# Patient Record
Sex: Male | Born: 1995 | Hispanic: Yes | Marital: Single
Health system: Southern US, Community
[De-identification: ages and names within clinical notes are randomized; demographics above are authoritative.]

---

## 2018-07-18 ENCOUNTER — Other Ambulatory Visit: Payer: Self-pay

## 2018-07-18 ENCOUNTER — Emergency Department (HOSPITAL_COMMUNITY)
Admission: EM | Admit: 2018-07-18 | Discharge: 2018-07-18 | Disposition: A | Discharge status: Home / Self Care | Payer: Self-pay | Attending: Emergency Medicine | Admitting: Emergency Medicine

## 2018-07-18 ENCOUNTER — Emergency Department (HOSPITAL_COMMUNITY): Payer: Self-pay

## 2018-07-18 DIAGNOSIS — R0602 Shortness of breath: Secondary | ICD-10-CM | POA: Insufficient documentation

## 2018-07-18 DIAGNOSIS — J40 Bronchitis, not specified as acute or chronic: Secondary | ICD-10-CM | POA: Insufficient documentation

## 2018-07-18 MED ORDER — IPRATROPIUM-ALBUTEROL 0.5-2.5 (3) MG/3ML IN SOLN
3.0000 mL | Freq: Once | RESPIRATORY_TRACT | Status: AC
  Administered 2018-07-18: 3 mL via RESPIRATORY_TRACT

## 2018-07-18 MED ORDER — ALBUTEROL SULFATE HFA 108 (90 BASE) MCG/ACT IN AERS
2.0000 | INHALATION_SPRAY | RESPIRATORY_TRACT | Status: DC | PRN
  Administered 2018-07-18: 2 via RESPIRATORY_TRACT
  Filled 2018-07-18: qty 6.7

## 2018-07-18 MED ORDER — PREDNISONE 10 MG PO TABS: 40.0000 mg | ORAL_TABLET | Freq: Every day | ORAL | 0 refills | Status: AC

## 2018-07-18 MED ORDER — BENZONATATE 100 MG PO CAPS: 100.0000 mg | ORAL_CAPSULE | Freq: Three times a day (TID) | ORAL | 0 refills | Status: AC

## 2018-07-18 NOTE — ED Triage Notes (Signed)
Pt here for increased shortness of breath and work of breathing over the last week.  Pt reports worse at night.  Denies any night sweats or neck soreness.  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  Lungs diminished in bases, shortness of breath worse when lying flat.

## 2018-07-18 NOTE — ED Provider Notes (Signed)
[facility] EMERGENCY DEPARTMENT Provider Note   CSN: [id number] Arrival date & time: 07/18/18  2035    History   Chief Complaint Chief Complaint  Patient presents with   Shortness of Breath    HPI Frederick Ford is a 23 y.o. male who presents to the ED with c/o cough and feeling short of breath x 1 week. Patient reports he has been home and not around anyone else. He states he has not traveled. He reports being out of work so has not been around anyone sick. Patient reports feeling like he is wheezing.     HPI  No past medical history on file.  There are no active problems to display for this patient.    Home Medications    Prior to Admission medications   Medication Sig Start Date End Date Taking? Authorizing Provider  benzonatate (TESSALON) 100 MG capsule Take 1 capsule (100 mg total) by mouth every 8 (eight) hours. 07/18/18   Janne Napoleon, NP  predniSONE (DELTASONE) 10 MG tablet Take 4 tablets (40 mg total) by mouth daily with breakfast. 07/18/18   Janne Napoleon, NP    Family History No family history on file.  Social History Social History   Tobacco Use   Smoking status: Not on file  Substance Use Topics   Alcohol use: Not on file   Drug use: Not on file     Allergies   Patient has no known allergies.   Review of Systems Review of Systems  Constitutional: Negative for chills and fever.  HENT: Positive for congestion and sneezing. Negative for ear pain, sinus pressure, sinus pain and sore throat.   Eyes: Negative for discharge, redness and itching.  Respiratory: Positive for cough, shortness of breath and wheezing.   Cardiovascular: Negative for chest pain.  Gastrointestinal: Negative for abdominal pain, nausea and vomiting.  Genitourinary: Negative for dysuria, frequency and urgency.  Musculoskeletal: Negative for myalgias.  Skin: Negative for rash.  Neurological: Negative for syncope and headaches.  Psychiatric/Behavioral:  Negative for confusion.     Physical Exam Updated Vital Signs BP 123/64 (BP Location: Right Arm)    Pulse 80    Temp 97.8 F (36.6 C) (Oral)    Resp 19    SpO2 100%   Physical Exam Vitals signs and nursing note reviewed.  Constitutional:      General: He is not in acute distress.    Appearance: He is well-developed.  HENT:     Head: Normocephalic.     Right Ear: Tympanic membrane normal.     Left Ear: Tympanic membrane normal.     Nose: Congestion present.     Mouth/Throat:     Mouth: Mucous membranes are moist.     Pharynx: Oropharynx is clear.  Eyes:     Extraocular Movements: Extraocular movements intact.     Conjunctiva/sclera: Conjunctivae normal.  Neck:     Musculoskeletal: Normal range of motion and neck supple. No neck rigidity.  Cardiovascular:     Rate and Rhythm: Normal rate and regular rhythm.  Pulmonary:     Effort: Pulmonary effort is normal. No respiratory distress.     Breath sounds: No wheezing or rales.     Comments: Examined after neb treatment.  Chest:     Chest wall: No tenderness.  Musculoskeletal: Normal range of motion.  Lymphadenopathy:     Cervical: No cervical adenopathy.  Skin:    General: Skin is warm and dry.  Neurological:  Mental Status: He is alert and oriented to person, place, and time.      ED Treatments / Results  Labs (all labs ordered are listed, but only abnormal results are displayed) Labs Reviewed - No data to display  Radiology Dg Chest 2 View  Result Date: 07/18/2018 CLINICAL DATA:  Shortness of breath. EXAM: CHEST - 2 VIEW COMPARISON:  None. FINDINGS: The lungs are clear without focal pneumonia, edema, pneumothorax or pleural effusion. Tiny nodule right mid lung likely a granuloma given patient age and the density of this nodule relative to its tiny size. The cardiopericardial silhouette is within normal limits for size. The visualized bony structures of the thorax are intact. IMPRESSION: No active cardiopulmonary  disease. Electronically Signed   By: Kennith Center M.D.   On: 07/18/2018 21:10    Procedures Procedures (including critical care time)  Medications Ordered in ED Medications  albuterol (PROVENTIL HFA;VENTOLIN HFA) 108 (90 Base) MCG/ACT inhaler 2 puff (2 puffs Inhalation Given 07/18/18 2226)  ipratropium-albuterol (DUONEB) 0.5-2.5 (3) MG/3ML nebulizer solution 3 mL (3 mLs Nebulization Given 07/18/18 2112)   Patient reports feeling more open after neb treatment.   Initial Impression / Assessment and Plan / ED Course  I have reviewed the triage vital signs and the nursing notes. Pt CXR negative for acute infiltrate. Patients symptoms are consistent with URI, likely viral etiology. Discussed that antibiotics are not indicated for viral infections. Pt will be discharged with symptomatic treatment.  Verbalizes understanding and is agreeable with plan. Pt is hemodynamically stable & in NAD prior to dc. Patient ask the question about screening for COVID 19 because his friend wanted to be sure patient did not have the virus. Discussed with the patient that he has no fever, has not traveled and reports he has not been around anyone that is sick and has been at home so he does not meet criteria for screening.   Final Clinical Impressions(s) / ED Diagnoses   Final diagnoses:  Bronchitis    ED Discharge Orders         Ordered    predniSONE (DELTASONE) 10 MG tablet  Daily with breakfast     07/18/18 2214    benzonatate (TESSALON) 100 MG capsule  Every 8 hours     07/18/18 2214           Kerrie [place], NP 07/18/18 2322    Terrilee Files, MD 07/19/18 1055

## 2018-07-18 NOTE — ED Notes (Signed)
Patient Alert and oriented to baseline. Stable and ambulatory to baseline. Patient verbalized understanding of the discharge instructions.  Patient belongings were taken by the patient.   

## 2018-07-18 NOTE — Discharge Instructions (Addendum)
Take tylenol and ibuprofen as needed for body aches. Your chest x-ray is negative. Since you have not been exposed to anyone with known COVID 19, you do not have a fever and have not traveled you do not meet the criteria for testing for the virus. Stay home, drink plenty of fluids and follow up with [facility] and wellness. Return here as needed.

## 2018-07-18 NOTE — ED Notes (Signed)
Pt back from X-ray.  

## 2018-07-18 NOTE — ED Notes (Signed)
Patient transported to X-ray 

## 2020-03-29 IMAGING — CR CHEST - 2 VIEW
2 series · 2 of 2 positions shown · non-contrast
Comparison: None.

CLINICAL DATA: Shortness of breath.

EXAM:
CHEST - 2 VIEW

[chest pa]
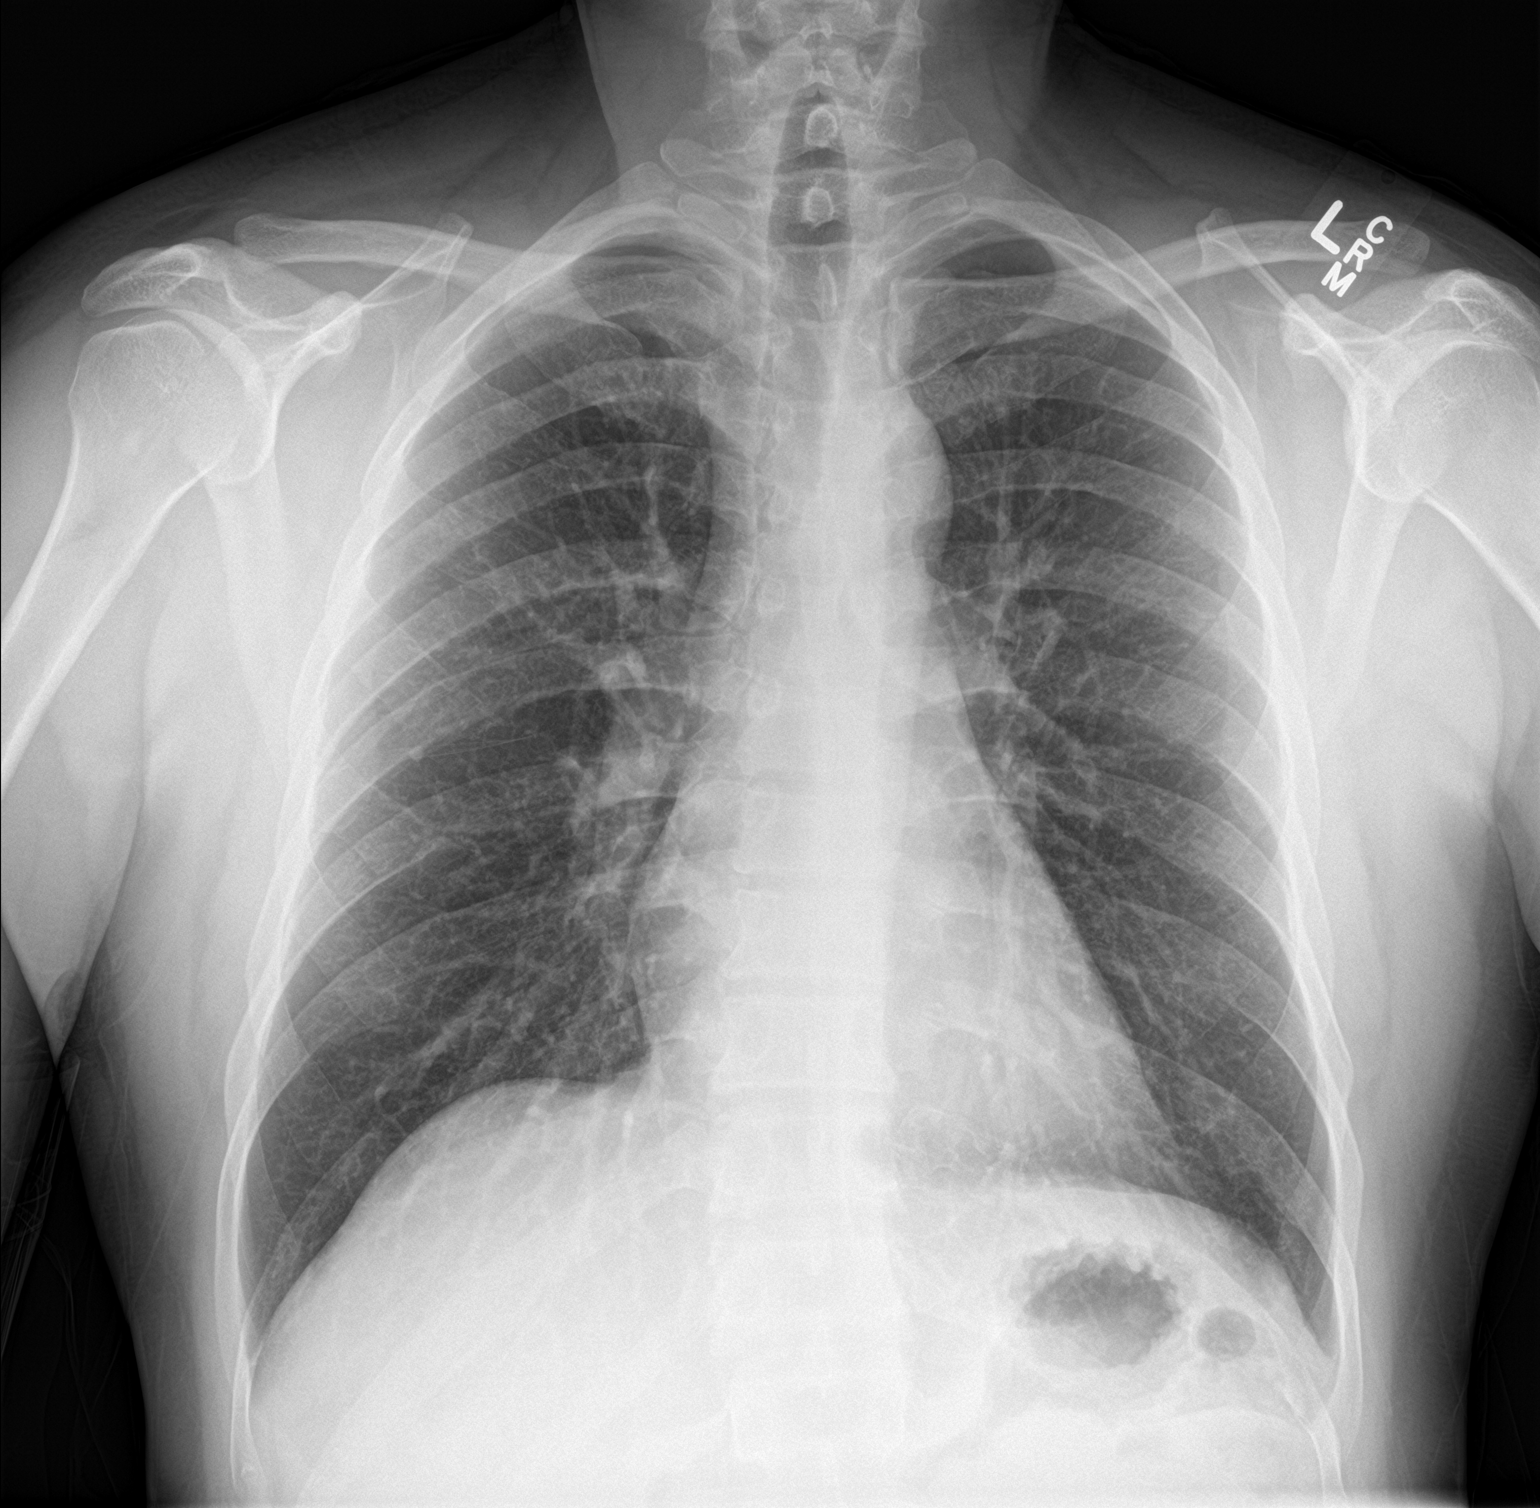

[chest lat]
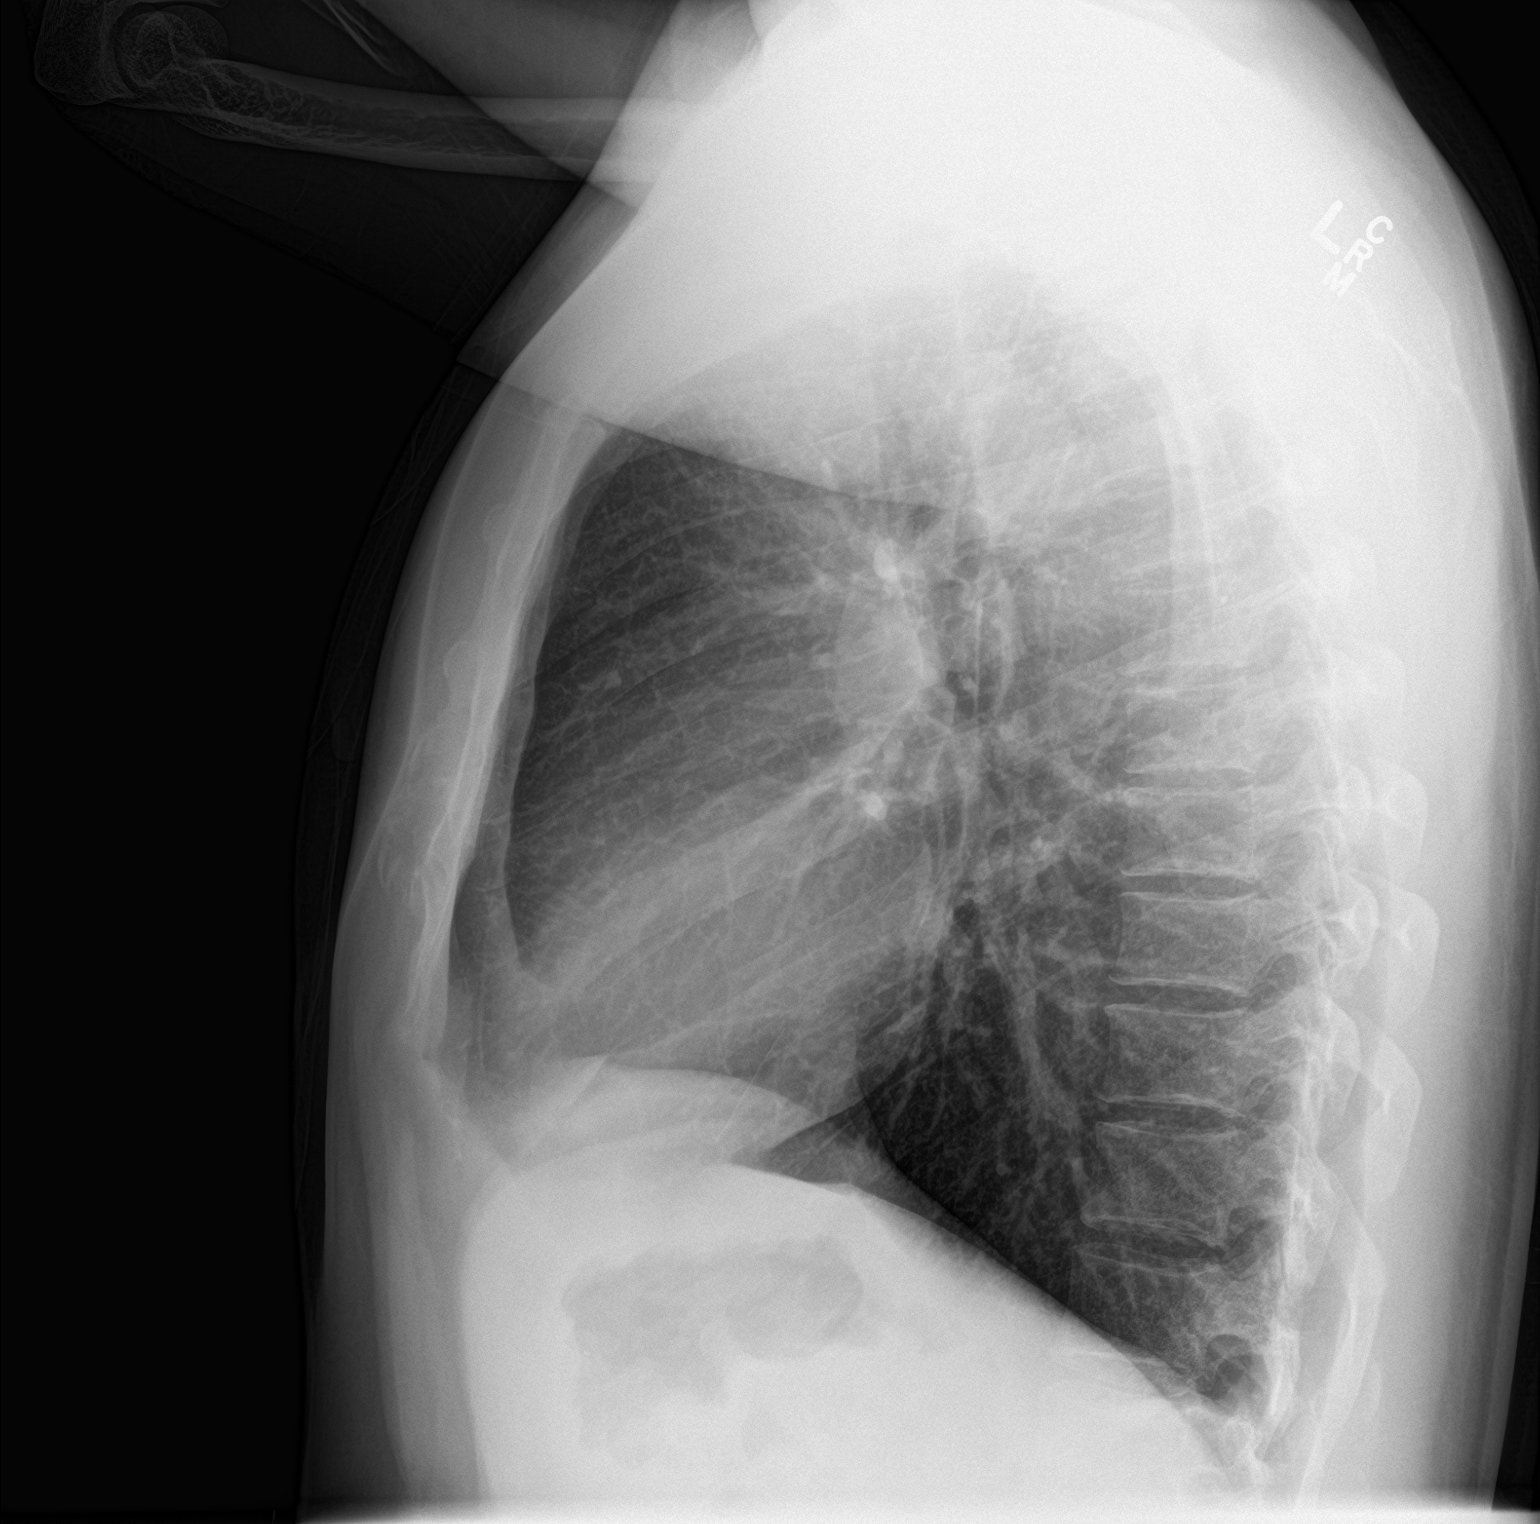

[2 of 2 positions shown; findings below may reference images not displayed]

FINDINGS: The lungs are clear without focal pneumonia, edema, pneumothorax or
pleural effusion. Tiny nodule right mid lung likely a granuloma
given patient age and the density of this nodule relative to its
tiny size. The cardiopericardial silhouette is within normal limits
for size. The visualized bony structures of the thorax are intact.
IMPRESSION: No active cardiopulmonary disease.
